# Patient Record
Sex: Female | Born: 1980 | Race: Black or African American | Hispanic: No | Marital: Married | Smoking: Current every day smoker
Health system: Southern US, Community
[De-identification: ages and names within clinical notes are randomized; demographics above are authoritative.]

---

## 2001-08-22 ENCOUNTER — Emergency Department (HOSPITAL_COMMUNITY): Admission: EM | Admit: 2001-08-22 | Discharge: 2001-08-23 | Payer: Self-pay | Admitting: Emergency Medicine

## 2003-11-30 ENCOUNTER — Other Ambulatory Visit: Payer: Self-pay

## 2005-10-08 ENCOUNTER — Emergency Department: Payer: Self-pay | Admitting: Emergency Medicine

## 2007-02-23 ENCOUNTER — Emergency Department: Payer: Self-pay | Admitting: Unknown Physician Specialty

## 2008-02-07 ENCOUNTER — Emergency Department: Payer: Self-pay | Admitting: Emergency Medicine

## 2008-04-25 ENCOUNTER — Emergency Department: Payer: Self-pay | Admitting: Emergency Medicine

## 2009-10-18 ENCOUNTER — Emergency Department: Payer: Self-pay | Admitting: Emergency Medicine

## 2010-03-09 ENCOUNTER — Observation Stay: Payer: Self-pay | Admitting: Obstetrics and Gynecology

## 2010-03-11 ENCOUNTER — Inpatient Hospital Stay: Payer: Self-pay | Admitting: Obstetrics and Gynecology

## 2013-05-30 ENCOUNTER — Emergency Department: Payer: Self-pay | Admitting: Emergency Medicine

## 2013-05-30 LAB — URINALYSIS, COMPLETE
Bilirubin,UR: NEGATIVE
Glucose,UR: NEGATIVE mg/dL (ref 0–75)
Ketone: NEGATIVE
Nitrite: POSITIVE
Ph: 7 (ref 4.5–8.0)
Specific Gravity: 1.019 (ref 1.003–1.030)
Squamous Epithelial: 10

## 2013-05-30 LAB — BASIC METABOLIC PANEL
Anion Gap: 5 — ABNORMAL LOW (ref 7–16)
BUN: 10 mg/dL (ref 7–18)
Calcium, Total: 9.3 mg/dL (ref 8.5–10.1)
EGFR (African American): >60
EGFR (Non-African Amer.): >60
Glucose: 103 mg/dL — ABNORMAL HIGH (ref 65–99)
Osmolality: 277 (ref 275–301)

## 2013-05-30 LAB — CBC
MCH: 28.5 pg (ref 26.0–34.0)
MCV: 84 fL (ref 80–100)
Platelet: 255 10*3/uL (ref 150–440)
WBC: 7 10*3/uL (ref 3.6–11.0)

## 2013-05-31 LAB — WET PREP, GENITAL

## 2013-05-31 LAB — GC/CHLAMYDIA PROBE AMP

## 2013-09-21 ENCOUNTER — Emergency Department: Payer: Self-pay | Admitting: Emergency Medicine

## 2013-09-21 LAB — COMPREHENSIVE METABOLIC PANEL
Albumin: 3.7 g/dL (ref 3.4–5.0)
Anion Gap: 2 — ABNORMAL LOW (ref 7–16)
BUN: 14 mg/dL (ref 7–18)
Bilirubin,Total: 0.7 mg/dL (ref 0.2–1.0)
Calcium, Total: 9.1 mg/dL (ref 8.5–10.1)
Creatinine: 1.21 mg/dL (ref 0.60–1.30)
EGFR (African American): >60
Potassium: 3.9 mmol/L (ref 3.5–5.1)
SGOT(AST): 25 U/L (ref 15–37)
Total Protein: 7.8 g/dL (ref 6.4–8.2)

## 2013-09-21 LAB — URINALYSIS, COMPLETE
Bilirubin,UR: NEGATIVE
Ketone: NEGATIVE
Nitrite: POSITIVE
Protein: 30
Squamous Epithelial: 6
WBC UR: 36 /HPF (ref 0–5)

## 2013-09-21 LAB — CBC WITH DIFFERENTIAL/PLATELET
Basophil %: 0.5 %
Eosinophil #: 0.1 10*3/uL (ref 0.0–0.7)
HCT: 35.2 % (ref 35.0–47.0)
Lymphocyte #: 1.5 10*3/uL (ref 1.0–3.6)
MCV: 84 fL (ref 80–100)
Monocyte %: 6.7 %
Neutrophil #: 3.8 10*3/uL (ref 1.4–6.5)
Platelet: 238 10*3/uL (ref 150–440)
RDW: 17.8 % — ABNORMAL HIGH (ref 11.5–14.5)
WBC: 5.8 10*3/uL (ref 3.6–11.0)

## 2013-09-21 LAB — LIPASE, BLOOD: Lipase: 48 U/L — ABNORMAL LOW (ref 73–393)

## 2013-11-20 ENCOUNTER — Emergency Department: Payer: Self-pay | Admitting: Emergency Medicine

## 2014-01-17 ENCOUNTER — Emergency Department: Payer: Self-pay | Admitting: Emergency Medicine

## 2014-01-17 LAB — CBC
HCT: 34.5 % — ABNORMAL LOW (ref 35.0–47.0)
HGB: 11.1 g/dL — AB (ref 12.0–16.0)
MCH: 27.7 pg (ref 26.0–34.0)
MCHC: 32.2 g/dL (ref 32.0–36.0)
MCV: 86 fL (ref 80–100)
Platelet: 241 10*3/uL (ref 150–440)
RBC: 4.02 10*6/uL (ref 3.80–5.20)
RDW: 16 % — AB (ref 11.5–14.5)
WBC: 6.5 10*3/uL (ref 3.6–11.0)

## 2014-01-17 LAB — COMPREHENSIVE METABOLIC PANEL
Albumin: 3.6 g/dL (ref 3.4–5.0)
Alkaline Phosphatase: 84 U/L
Anion Gap: 6 — ABNORMAL LOW (ref 7–16)
BUN: 8 mg/dL (ref 7–18)
Bilirubin,Total: 0.4 mg/dL (ref 0.2–1.0)
Calcium, Total: 8.7 mg/dL (ref 8.5–10.1)
Chloride: 109 mmol/L — ABNORMAL HIGH (ref 98–107)
Co2: 25 mmol/L (ref 21–32)
Creatinine: 0.84 mg/dL (ref 0.60–1.30)
EGFR (African American): >60
EGFR (Non-African Amer.): >60
Glucose: 86 mg/dL (ref 65–99)
Osmolality: 277 (ref 275–301)
Potassium: 3.8 mmol/L (ref 3.5–5.1)
SGOT(AST): 19 U/L (ref 15–37)
SGPT (ALT): 17 U/L (ref 12–78)
Sodium: 140 mmol/L (ref 136–145)
Total Protein: 7.8 g/dL (ref 6.4–8.2)

## 2014-03-02 ENCOUNTER — Emergency Department: Payer: Self-pay | Admitting: Emergency Medicine

## 2016-03-04 DIAGNOSIS — R0602 Shortness of breath: Secondary | ICD-10-CM | POA: Diagnosis not present

## 2016-03-04 DIAGNOSIS — F17210 Nicotine dependence, cigarettes, uncomplicated: Secondary | ICD-10-CM | POA: Diagnosis not present

## 2016-03-04 DIAGNOSIS — R0789 Other chest pain: Secondary | ICD-10-CM | POA: Diagnosis present

## 2016-03-04 NOTE — ED Notes (Signed)
Pt reports intermittent chest tightness with dry cough for 2 days; tonight the pain has been more constant; denies fever; dizziness/lightheaded 2 days ago but not currently; pt says pain worse with deep inspiration and when she lays down; pt with hoarse voice in triage; talking in complete coherent sentences

## 2016-03-05 ENCOUNTER — Emergency Department: Payer: Medicaid Other

## 2016-03-05 ENCOUNTER — Encounter: Payer: Self-pay | Admitting: Emergency Medicine

## 2016-03-05 ENCOUNTER — Emergency Department
Admission: EM | Admit: 2016-03-05 | Discharge: 2016-03-05 | Disposition: A | Discharge status: Home / Self Care | Payer: Medicaid Other | Attending: Emergency Medicine | Admitting: Emergency Medicine

## 2016-03-05 DIAGNOSIS — R0602 Shortness of breath: Secondary | ICD-10-CM

## 2016-03-05 DIAGNOSIS — R079 Chest pain, unspecified: Secondary | ICD-10-CM

## 2016-03-05 LAB — BASIC METABOLIC PANEL
ANION GAP: 7 (ref 5–15)
BUN: 11 mg/dL (ref 6–20)
CHLORIDE: 110 mmol/L (ref 101–111)
CO2: 21 mmol/L — ABNORMAL LOW (ref 22–32)
CREATININE: 0.74 mg/dL (ref 0.44–1.00)
Calcium: 9.2 mg/dL (ref 8.9–10.3)
GFR calc non Af Amer: >60 mL/min (ref >60)
Glucose, Bld: 97 mg/dL (ref 65–99)
POTASSIUM: 4 mmol/L (ref 3.5–5.1)
SODIUM: 138 mmol/L (ref 135–145)

## 2016-03-05 LAB — CBC
HCT: 33.8 % — ABNORMAL LOW (ref 35.0–47.0)
HEMOGLOBIN: 11 g/dL — AB (ref 12.0–16.0)
MCH: 26.5 pg (ref 26.0–34.0)
MCHC: 32.7 g/dL (ref 32.0–36.0)
MCV: 81.1 fL (ref 80.0–100.0)
PLATELETS: 252 10*3/uL (ref 150–440)
RBC: 4.16 MIL/uL (ref 3.80–5.20)
RDW: 15.6 % — ABNORMAL HIGH (ref 11.5–14.5)
WBC: 6 10*3/uL (ref 3.6–11.0)

## 2016-03-05 LAB — TROPONIN I

## 2016-03-05 LAB — FIBRIN DERIVATIVES D-DIMER (ARMC ONLY): Fibrin derivatives D-dimer (ARMC): 920 — ABNORMAL HIGH (ref 0–499)

## 2016-03-05 MED ORDER — AZITHROMYCIN 250 MG PO TABS: ORAL_TABLET | ORAL | Status: AC

## 2016-03-05 MED ORDER — ALBUTEROL SULFATE HFA 108 (90 BASE) MCG/ACT IN AERS: 2.0000 | INHALATION_SPRAY | Freq: Four times a day (QID) | RESPIRATORY_TRACT | Status: AC | PRN

## 2016-03-05 MED ORDER — IOPAMIDOL (ISOVUE-370) INJECTION 76%
75.0000 mL | Freq: Once | INTRAVENOUS | Status: AC | PRN
  Administered 2016-03-05: 75 mL via INTRAVENOUS

## 2016-03-05 MED ORDER — IPRATROPIUM-ALBUTEROL 0.5-2.5 (3) MG/3ML IN SOLN
3.0000 mL | Freq: Once | RESPIRATORY_TRACT | Status: AC
  Administered 2016-03-05: 3 mL via RESPIRATORY_TRACT
  Filled 2016-03-05: qty 3

## 2016-03-05 NOTE — ED Provider Notes (Signed)
[facility] Emergency Department Provider Note   ____________________________________________  Time seen: Approximately 210 AM  I have reviewed the triage vital signs and the nursing notes.   HISTORY  Chief Complaint Chest Pain    HPI Grace Webster is a 35 y.o. female comes into the hospital today with chest pain, shortness of breath and wheezing. The patient reports that her symptoms started 2 days ago. She reports that she does not remember exactly what was going on when the pain started. The pain is intermittent chest and she reports is worse when she takes a deep breath. The patient denies any recent travel. The pain does not radiate. The initial day she had some dizziness but since then she's been okay. She's had no sweats, nausea or vomiting. She has not taken anything for the pain. Currently the patient rates her pain a 4 out of 10 in intensity and is achy. She's never had this before. It was initially constant but now comes and goes. She's also had a mild cough with this pain. The patient was concerned about her symptoms she decided to come in to get checked out.   History reviewed. No pertinent past medical history.  There are no active problems to display for this patient.   History reviewed. No pertinent past surgical history.  Current Outpatient Rx  Name  Route  Sig  Dispense  Refill  . albuterol (PROVENTIL HFA;VENTOLIN HFA) 108 (90 Base) MCG/ACT inhaler   Inhalation   Inhale 2 puffs into the lungs every 6 (six) hours as needed.   1 Inhaler   0     Allergies Review of patient's allergies indicates no known allergies.  History reviewed. No pertinent family history.  Social History Social History  Substance Use Topics  . Smoking status: Current Every Day Smoker -- 5.00 packs/day    Types: Cigarettes  . Smokeless tobacco: None  . Alcohol Use: Yes     Comment: occasional    Review of Systems Constitutional: No  fever/chills Eyes: No visual changes. ENT: No sore throat. Cardiovascular: chest pain. Respiratory: Wheezing and shortness of breath. Gastrointestinal: No abdominal pain.  No nausea, no vomiting.  No diarrhea.  No constipation. Genitourinary: Negative for dysuria. Musculoskeletal: Negative for back pain. Skin: Negative for rash. Neurological: Negative for headaches, focal weakness or numbness.  10-point ROS otherwise negative.  ____________________________________________   PHYSICAL EXAM:  VITAL SIGNS: ED Triage Vitals  Enc Vitals Group     BP 03/04/16 2358 165/96 mmHg     Pulse Rate 03/04/16 2358 71     Resp 03/04/16 2358 18     Temp 03/04/16 2358 98.5 F (36.9 C)     Temp Source 03/04/16 2358 Oral     SpO2 03/04/16 2358 99 %     Weight 03/04/16 2358 240 lb (108.863 kg)     Height 03/04/16 2358 5\' 7"  (1.702 m)     Head Cir --      Peak Flow --      Pain Score 03/04/16 2359 4     Pain Loc --      Pain Edu? --      Excl. in GC? --     Constitutional: Alert and oriented. Well appearing and in Mild distress. Eyes: Conjunctivae are normal. PERRL. EOMI. Head: Atraumatic. Nose: No congestion/rhinnorhea. Mouth/Throat: Mucous membranes are moist.  Oropharynx non-erythematous. Cardiovascular: Normal rate, regular rhythm. Grossly normal heart sounds.  Good peripheral circulation. Respiratory: Normal respiratory effort.  No  retractions. Mild anterior wheezes. Gastrointestinal: Soft and nontender. No distention. Positive bowel sounds. Musculoskeletal: No lower extremity tenderness nor edema.  Neurologic:  Normal speech and language.  Skin:  Skin is warm, dry and intact.  Psychiatric: Mood and affect are normal.   ____________________________________________   LABS (all labs ordered are listed, but only abnormal results are displayed)  Labs Reviewed  BASIC METABOLIC PANEL - Abnormal; Notable for the following:    CO2 21 (*)    All other components within normal limits   CBC - Abnormal; Notable for the following:    Hemoglobin 11.0 (*)    HCT 33.8 (*)    RDW 15.6 (*)    All other components within normal limits  FIBRIN DERIVATIVES D-DIMER ([facility] ONLY) - Abnormal; Notable for the following:    Fibrin derivatives D-dimer ([facility]) 920 (*)    All other components within normal limits  TROPONIN I  TROPONIN I   ____________________________________________  EKG  ED ECG REPORT I, Rebecka Apley, the attending physician, personally viewed and interpreted this ECG.   Date: 03/05/2016  EKG Time: 0011  Rate: 72  Rhythm: normal sinus rhythm  Axis: normal  Intervals:none  ST&T Change: none  ____________________________________________  RADIOLOGY  CXR: Mild diffuse peribronchial thickening, which may relate to acute bronchiolitis, reactive airways disease/ asthma or history of smoking, No opacity to suggest pneumona   CT angio chest: Examination is nondiagnostic for evaluation of pulmonary arteries, pulmonary embolus is not excluded, diffuse fine nodular infiltration throughout the lungs maybe due to bronchopneumonia or other fine nodular infiltrative process. ____________________________________________   PROCEDURES  Procedure(s) performed: None  Critical Care performed: No  ____________________________________________   INITIAL IMPRESSION / ASSESSMENT AND PLAN / ED COURSE  Pertinent labs & imaging results that were available during my care of the patient were reviewed by me and considered in my medical decision making (see chart for details).  Is a 35 year old female who comes in status post today with some chest pain or shortness of breath. As the patient does have some pleuritic pain I did order a d-dimer which was positive. The patient did receive a DuoNeb as she had some wheezing as well but I will perform a CTA of her chest to evaluate for PE.  The patient reports that after the DuoNeb she feels much better. I informed her that the CT  of her chest did not see a PE but it was not diagnostic because of a poor bolus timing. The patient is not tachycardic nor is she hypoxic or hypotensive. Given that her symptoms improved with the breathing treatment and her vital signs are stable I think the patient is stable enough to be discharged home. The patient should follow-up with the acute care clinic for further evaluation. ____________________________________________   FINAL CLINICAL IMPRESSION(S) / ED DIAGNOSES  Final diagnoses:  Chest pain, unspecified chest pain type  Shortness of breath      NEW MEDICATIONS STARTED DURING THIS VISIT:  New Prescriptions   ALBUTEROL (PROVENTIL HFA;VENTOLIN HFA) 108 (90 BASE) MCG/ACT INHALER    Inhale 2 puffs into the lungs every 6 (six) hours as needed.     Note:  This document was prepared using Dragon voice recognition software and may include unintentional dictation errors.    Rebecka Apley, MD 03/05/16 [phone]

## 2016-03-05 NOTE — Discharge Instructions (Signed)
Nonspecific Chest Pain  °Chest pain can be caused by many different conditions. There is always a chance that your pain could be related to something serious, such as a heart attack or a blood clot in your lungs. Chest pain can also be caused by conditions that are not life-threatening. If you have chest pain, it is very important to follow up with your health care provider. °CAUSES  °Chest pain can be caused by: °· Heartburn. °· Pneumonia or bronchitis. °· Anxiety or stress. °· Inflammation around your heart (pericarditis) or lung (pleuritis or pleurisy). °· A blood clot in your lung. °· A collapsed lung (pneumothorax). It can develop suddenly on its own (spontaneous pneumothorax) or from trauma to the chest. °· Shingles infection (varicella-zoster virus). °· Heart attack. °· Damage to the bones, muscles, and cartilage that make up your chest wall. This can include: °· Bruised bones due to injury. °· Strained muscles or cartilage due to frequent or repeated coughing or overwork. °· Fracture to one or more ribs. °· Sore cartilage due to inflammation (costochondritis). °RISK FACTORS  °Risk factors for chest pain may include: °· Activities that increase your risk for trauma or injury to your chest. °· Respiratory infections or conditions that cause frequent coughing. °· Medical conditions or overeating that can cause heartburn. °· Heart disease or family history of heart disease. °· Conditions or health behaviors that increase your risk of developing a blood clot. °· Having had chicken pox (varicella zoster). °SIGNS AND SYMPTOMS °Chest pain can feel like: °· Burning or tingling on the surface of your chest or deep in your chest. °· Crushing, pressure, aching, or squeezing pain. °· Dull or sharp pain that is worse when you move, cough, or take a deep breath. °· Pain that is also felt in your back, neck, shoulder, or arm, or pain that spreads to any of these areas. °Your chest pain may come and go, or it may stay  constant. °DIAGNOSIS °Lab tests or other studies may be needed to find the cause of your pain. Your health care provider may have you take a test called an ambulatory ECG (electrocardiogram). An ECG records your heartbeat patterns at the time the test is performed. You may also have other tests, such as: °· Transthoracic echocardiogram (TTE). During echocardiography, sound waves are used to create a picture of all of the heart structures and to look at how blood flows through your heart. °· Transesophageal echocardiogram (TEE). This is a more advanced imaging test that obtains images from inside your body. It allows your health care provider to see your heart in finer detail. °· Cardiac monitoring. This allows your health care provider to monitor your heart rate and rhythm in real time. °· Holter monitor. This is a portable device that records your heartbeat and can help to diagnose abnormal heartbeats. It allows your health care provider to track your heart activity for several days, if needed. °· Stress tests. These can be done through exercise or by taking medicine that makes your heart beat more quickly. °· Blood tests. °· Imaging tests. °TREATMENT  °Your treatment depends on what is causing your chest pain. Treatment may include: °· Medicines. These may include: °· Acid blockers for heartburn. °· Anti-inflammatory medicine. °· Pain medicine for inflammatory conditions. °· Antibiotic medicine, if an infection is present. °· Medicines to dissolve blood clots. °· Medicines to treat coronary artery disease. °· Supportive care for conditions that do not require medicines. This may include: °· Resting. °· Applying heat   or cold packs to injured areas. °· Limiting activities until pain decreases. °HOME CARE INSTRUCTIONS °· If you were prescribed an antibiotic medicine, finish it all even if you start to feel better. °· Avoid any activities that bring on chest pain. °· Do not use any tobacco products, including  cigarettes, chewing tobacco, or electronic cigarettes. If you need help quitting, ask your health care provider. °· Do not drink alcohol. °· Take medicines only as directed by your health care provider. °· Keep all follow-up visits as directed by your health care provider. This is important. This includes any further testing if your chest pain does not go away. °· If heartburn is the cause for your chest pain, you may be told to keep your head raised (elevated) while sleeping. This reduces the chance that acid will go from your stomach into your esophagus. °· Make lifestyle changes as directed by your health care provider. These may include: °· Getting regular exercise. Ask your health care provider to suggest some activities that are safe for you. °· Eating a heart-healthy diet. A registered dietitian can help you to learn healthy eating options. °· Maintaining a healthy weight. °· Managing diabetes, if necessary. °· Reducing stress. °SEEK MEDICAL CARE IF: °· Your chest pain does not go away after treatment. °· You have a rash with blisters on your chest. °· You have a fever. °SEEK IMMEDIATE MEDICAL CARE IF:  °· Your chest pain is worse. °· You have an increasing cough, or you cough up blood. °· You have severe abdominal pain. °· You have severe weakness. °· You faint. °· You have chills. °· You have sudden, unexplained chest discomfort. °· You have sudden, unexplained discomfort in your arms, back, neck, or jaw. °· You have shortness of breath at any time. °· You suddenly start to sweat, or your skin gets clammy. °· You feel nauseous or you vomit. °· You suddenly feel light-headed or dizzy. °· Your heart begins to beat quickly, or it feels like it is skipping beats. °These symptoms may represent a serious problem that is an emergency. Do not wait to see if the symptoms will go away. Get medical help right away. Call your local emergency services (911 in the [place] Do not drive yourself to the hospital. °  °This  information is not intended to replace advice given to you by your health care provider. Make sure you discuss any questions you have with your health care provider. °  °Document Released: 07/26/2005 Document Revised: 11/06/2014 Document Reviewed: 05/22/2014 °Elsevier Interactive Patient Education ©2016 [employer] ° °Shortness of Breath °Shortness of breath means you have trouble breathing. It could also mean that you have a medical problem. You should get immediate medical care for shortness of breath. °CAUSES  °· Not enough oxygen in the air such as with high altitudes or a smoke-filled room. °· Certain lung diseases, infections, or problems. °· Heart disease or conditions, such as angina or heart failure. °· Low red blood cells (anemia). °· Poor physical fitness, which can cause shortness of breath when you exercise. °· Chest or back injuries or stiffness. °· Being overweight. °· Smoking. °· Anxiety, which can make you feel like you are not getting enough air. °DIAGNOSIS  °Serious medical problems can often be found during your physical exam. Tests may also be done to determine why you are having shortness of breath. Tests may include: °· Chest X-rays. °· Lung function tests. °· Blood tests. °· An electrocardiogram (ECG). °· An ambulatory   electrocardiogram. An ambulatory ECG records your heartbeat patterns over a 24-hour period. °· Exercise testing. °· A transthoracic echocardiogram (TTE). During echocardiography, sound waves are used to evaluate how blood flows through your heart. °· A transesophageal echocardiogram (TEE). °· Imaging scans. °Your health care provider may not be able to find a cause for your shortness of breath after your exam. In this case, it is important to have a follow-up exam with your health care provider as directed.  °TREATMENT  °Treatment for shortness of breath depends on the cause of your symptoms and can vary greatly. °HOME CARE INSTRUCTIONS  °· Do not smoke. Smoking is a common  cause of shortness of breath. If you smoke, ask for help to quit. °· Avoid being around chemicals or things that may bother your breathing, such as paint fumes and dust. °· Rest as needed. Slowly resume your usual activities. °· If medicines were prescribed, take them as directed for the full length of time directed. This includes oxygen and any inhaled medicines. °· Keep all follow-up appointments as directed by your health care provider. °SEEK MEDICAL CARE IF:  °· Your condition does not improve in the time expected. °· You have a hard time doing your normal activities even with rest. °· You have any new symptoms. °SEEK IMMEDIATE MEDICAL CARE IF:  °· Your shortness of breath gets worse. °· You feel light-headed, faint, or develop a cough not controlled with medicines. °· You start coughing up blood. °· You have pain with breathing. °· You have chest pain or pain in your arms, shoulders, or abdomen. °· You have a fever. °· You are unable to walk up stairs or exercise the way you normally do. °MAKE SURE YOU: °· Understand these instructions. °· Will watch your condition. °· Will get help right away if you are not doing well or get worse. °  °This information is not intended to replace advice given to you by your health care provider. Make sure you discuss any questions you have with your health care provider. °  °Document Released: 07/11/2001 Document Revised: 10/21/2013 Document Reviewed: 01/01/2012 °Elsevier Interactive Patient Education ©2016 [employer] Inc. ° °

## 2016-09-08 IMAGING — CT CT ANGIO CHEST
2 of 6 series · 19 of 46 positions shown · IV contrast (APPLIED)
Comparison: None.

CLINICAL DATA: Intermittent chest tightness and dry cough for 2
days. Increasing pain. Fever, dizziness, lightheaded. Shortness of
breath.

EXAM:
CT ANGIOGRAPHY CHEST WITH CONTRAST
TECHNIQUE: Multidetector CT imaging of the chest was performed using the
standard protocol during bolus administration of intravenous
contrast. Multiplanar CT image reconstructions and MIPs were
obtained to evaluate the vascular anatomy.
CONTRAST:  75 mL Isovue 370

[Series 6: pe thins 1.5 · axial · 0.68mm/px · z∈[-583,-301]mm · 16 of 263 slices shown]
[im 14/263  lung]
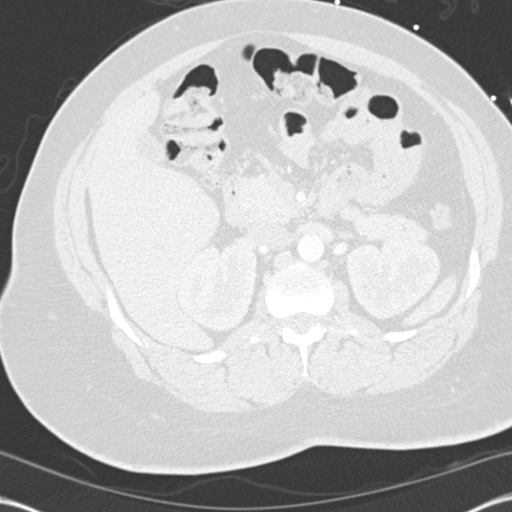
[im 28/263  soft-tissue]
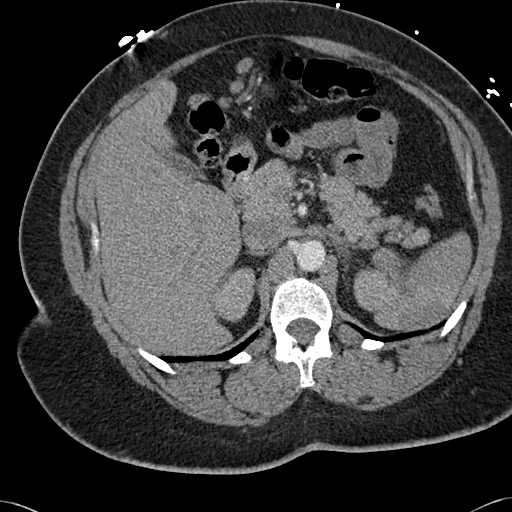
[im 42/263  lung]
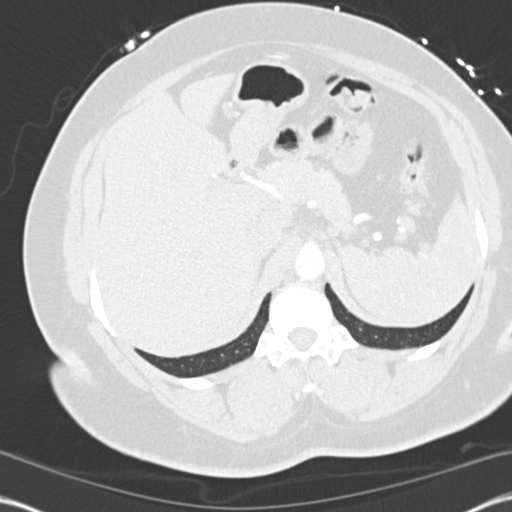
[im 56/263  soft-tissue]
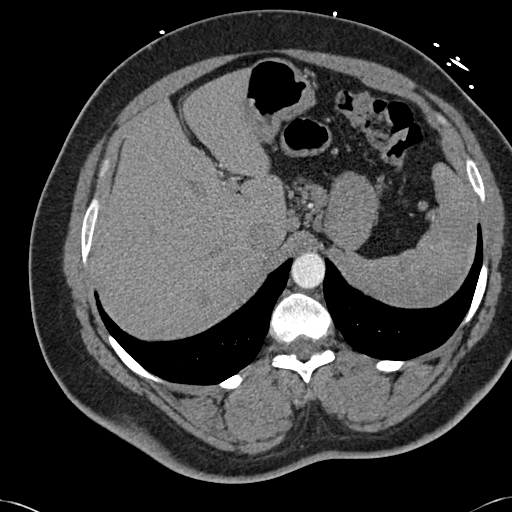
[im 83/263  lung]
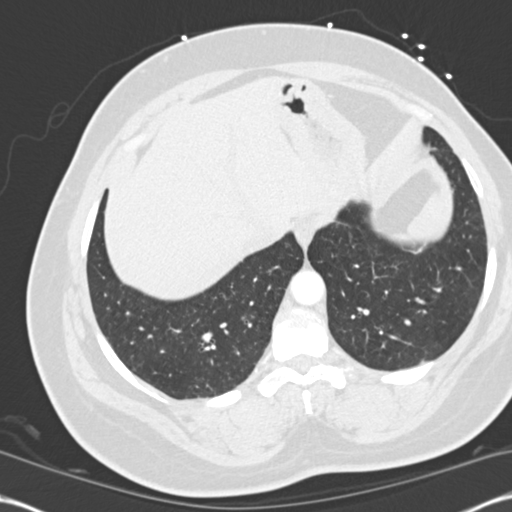
[im 97/263  soft-tissue]
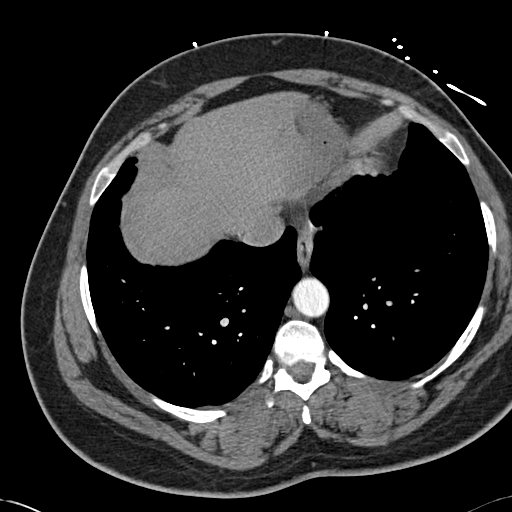
[im 111/263  lung]
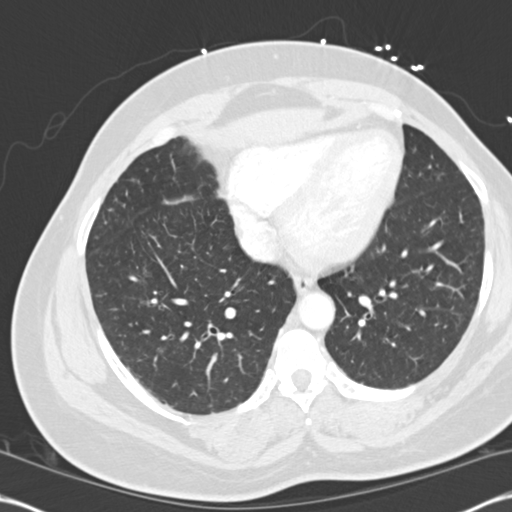
[im 125/263  soft-tissue]
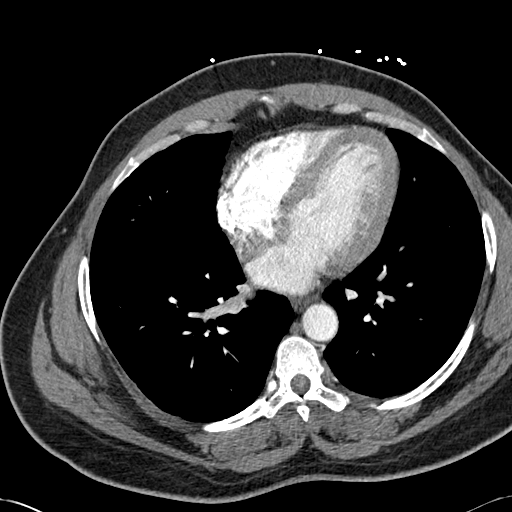
[im 138/263  lung]
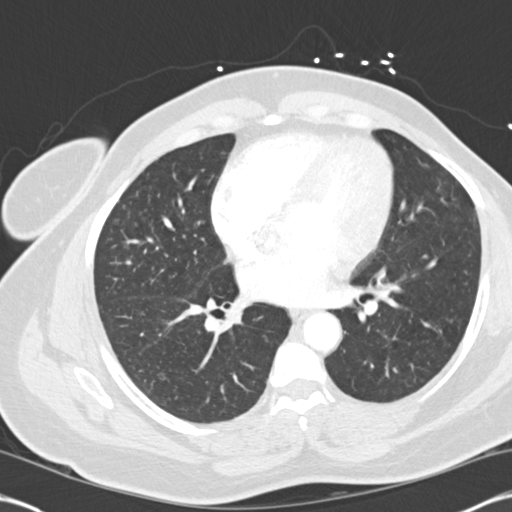
[im 152/263  soft-tissue]
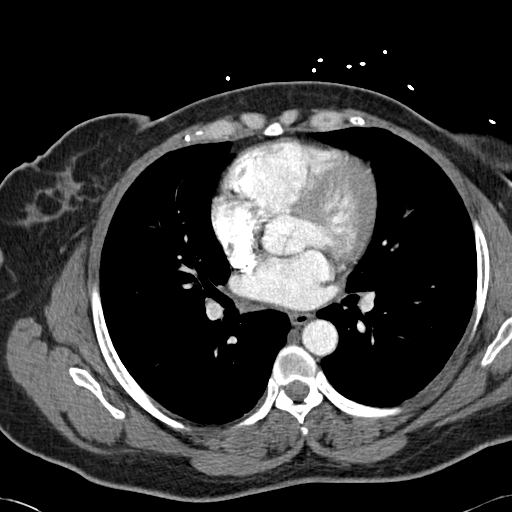
[im 166/263  lung]
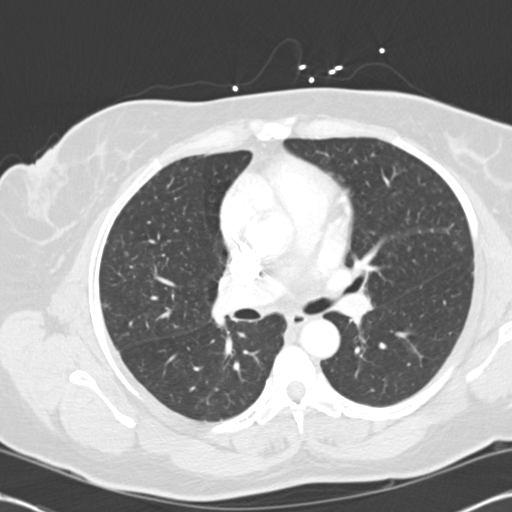
[im 180/263  soft-tissue]
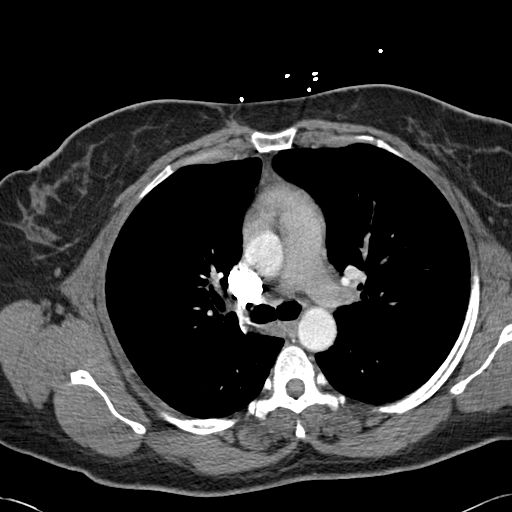
[im 207/263  lung]
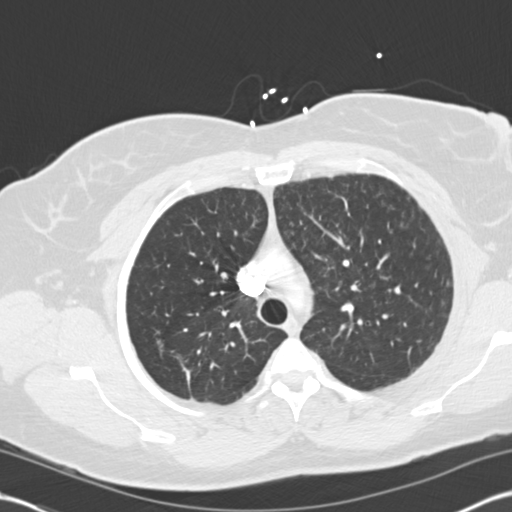
[im 221/263  soft-tissue]
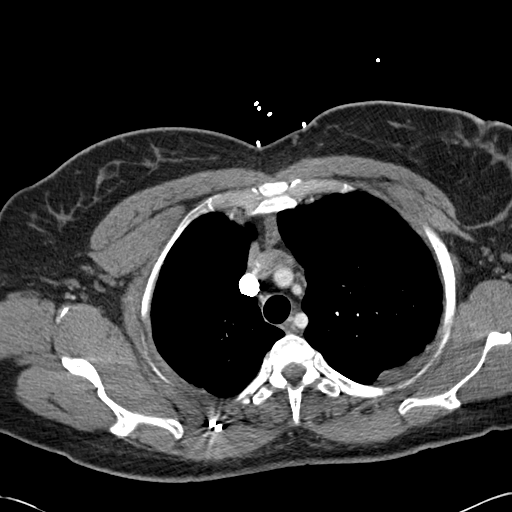
[im 235/263  lung]
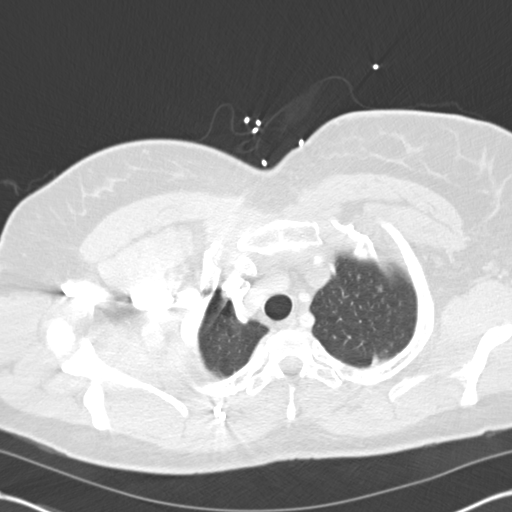
[im 249/263  soft-tissue]
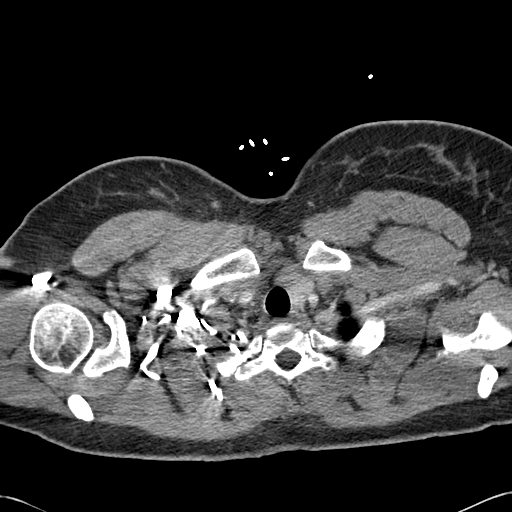

[Series 11: cor mpr 2.0 · coronal · 0.60mm/px · 3 of 139 slices shown]
[im 35/139  soft-tissue]
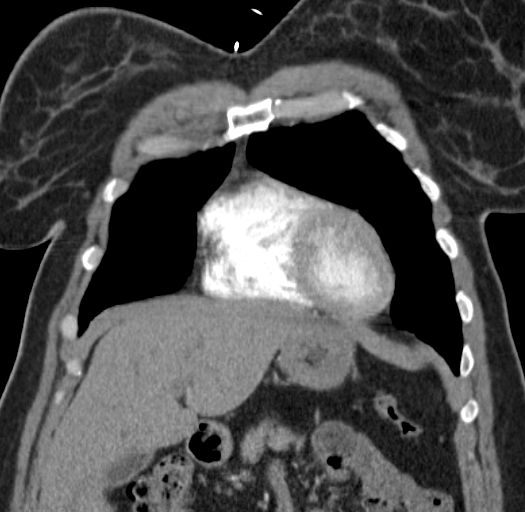
[im 70/139  soft-tissue]
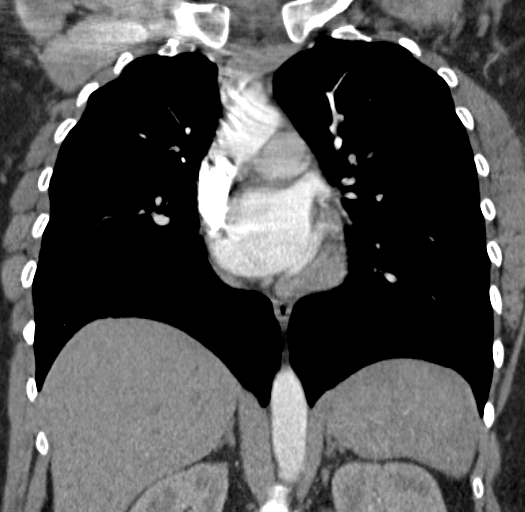
[im 104/139  soft-tissue]
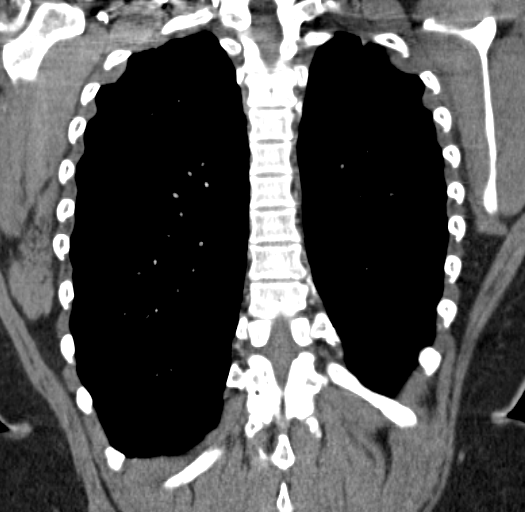

[19 of 46 positions shown; findings below may reference images not displayed]

FINDINGS: Examination is nondiagnostic for evaluation of the pulmonary
arteries due to poor bolus timing resulting in limited enhancement
of the pulmonary arteries.

Normal heart size. Normal caliber thoracic aorta. No aortic
dissection. Great vessel origins are patent. No significant
lymphadenopathy in the chest. Esophagus is decompressed.

Lungs demonstrate patchy diffuse fine nodular infiltration possibly
due to bronchopneumonia or other infiltrative process. Atypical
pneumonia could have this appearance. Could also consider chronic
pneumoconiosis. No focal consolidation. Airways are patent. No
pleural effusions. No pneumothorax.

Included portions of the upper abdominal organs are grossly
unremarkable. No destructive bone lesions.

Review of the MIP images confirms the above findings.
IMPRESSION: Examination is nondiagnostic for evaluation of pulmonary arteries.
Pulmonary embolus is not excluded.

Diffuse fine nodular infiltration throughout the lungs may be due to
bronchopneumonia or other fine nodular infiltrative process.

## 2019-11-10 ENCOUNTER — Ambulatory Visit: Payer: Medicaid Other | Attending: Internal Medicine

## 2019-11-10 DIAGNOSIS — Z20822 Contact with and (suspected) exposure to covid-19: Secondary | ICD-10-CM

## 2019-11-11 ENCOUNTER — Ambulatory Visit: Payer: Self-pay | Admitting: *Deleted

## 2019-11-11 ENCOUNTER — Telehealth: Payer: Self-pay | Admitting: *Deleted

## 2019-11-11 LAB — NOVEL CORONAVIRUS, NAA: SARS-CoV-2, NAA: NOT DETECTED

## 2019-11-11 NOTE — Telephone Encounter (Signed)
   Reason for Disposition . COVID-19 Testing, questions about  Answer Assessment - Initial Assessment Questions 1. COVID-19 DIAGNOSIS: "Who made your Coronavirus (COVID-19) diagnosis?" "Was it confirmed by a positive lab test?" If not diagnosed by a HCP, ask "Are there lots of cases (community spread) where you live?" (See public health department website, if unsure)     [place] 2. COVID-19 EXPOSURE: "Was there any known exposure to COVID before the symptoms began?" CDC Definition of close contact: within 6 feet (2 meters) for a total of 15 minutes or more over a 24-hour period.      na 3. ONSET: "When did the COVID-19 symptoms start?"      na 4. WORST SYMPTOM: "What is your worst symptom?" (e.g., cough, fever, shortness of breath, muscle aches)     na 5. COUGH: "Do you have a cough?" If so, ask: "How bad is the cough?"       na 6. FEVER: "Do you have a fever?" If so, ask: "What is your temperature, how was it measured, and when did it start?"     na 7. RESPIRATORY STATUS: "Describe your breathing?" (e.g., shortness of breath, wheezing, unable to speak)      na 8. BETTER-SAME-WORSE: "Are you getting better, staying the same or getting worse compared to yesterday?"  If getting worse, ask, "In what way?"     na 9. HIGH RISK DISEASE: "Do you have any chronic medical problems?" (e.g., asthma, heart or lung disease, weak immune system, obesity, etc.)     na 10. PREGNANCY: "Is there any chance you are pregnant?" "When was your last menstrual period?"       na 11. OTHER SYMPTOMS: "Do you have any other symptoms?"  (e.g., chills, fatigue, headache, loss of smell or taste, muscle pain, sore throat; new loss of smell or taste especially support the diagnosis of COVID-19)       Na, just wanting results.  Protocols used: CORONAVIRUS (COVID-19) DIAGNOSED OR SUSPECTED-A-AH

## 2019-11-11 NOTE — Telephone Encounter (Signed)
Pt called for COVID lab results, the test showed not detected. Pt verbalized understanding.

## 2020-05-15 ENCOUNTER — Emergency Department
Admission: EM | Admit: 2020-05-15 | Discharge: 2020-05-15 | Disposition: A | Discharge status: Home / Self Care | Payer: Medicaid Other | Attending: Emergency Medicine | Admitting: Emergency Medicine

## 2020-05-15 ENCOUNTER — Other Ambulatory Visit: Payer: Self-pay

## 2020-05-15 ENCOUNTER — Encounter: Payer: Self-pay | Admitting: Emergency Medicine

## 2020-05-15 DIAGNOSIS — F17210 Nicotine dependence, cigarettes, uncomplicated: Secondary | ICD-10-CM | POA: Insufficient documentation

## 2020-05-15 DIAGNOSIS — K029 Dental caries, unspecified: Secondary | ICD-10-CM | POA: Insufficient documentation

## 2020-05-15 DIAGNOSIS — K047 Periapical abscess without sinus: Secondary | ICD-10-CM | POA: Insufficient documentation

## 2020-05-15 MED ORDER — AMOXICILLIN 875 MG PO TABS: 875.0000 mg | ORAL_TABLET | Freq: Two times a day (BID) | ORAL | 0 refills | Status: DC

## 2020-05-15 MED ORDER — HYDROCODONE-ACETAMINOPHEN 5-325 MG PO TABS: 1.0000 | ORAL_TABLET | Freq: Four times a day (QID) | ORAL | 0 refills | Status: DC | PRN

## 2020-05-15 MED ORDER — IBUPROFEN 800 MG PO TABS
800.0000 mg | ORAL_TABLET | Freq: Once | ORAL | Status: AC
  Administered 2020-05-15: 800 mg via ORAL
  Filled 2020-05-15: qty 1

## 2020-05-15 NOTE — ED Provider Notes (Signed)
[facility] Emergency Department Provider Note  ____________________________________________   First MD Initiated Contact with Patient 05/15/20 1341     (approximate)  I have reviewed the triage vital signs and the nursing notes.   HISTORY  Chief Complaint Dental Pain    HPI Grace Webster is a 39 y.o. female presents emergency department complaint of dental abscess on the right upper gumline.  Some swelling in the face.  No fever or chills.  Area is very painful.  She states she called multiple dentist office and was unable to get an appointment.  She does have dental insurance.    History reviewed. No pertinent past medical history.  There are no problems to display for this patient.   History reviewed. No pertinent surgical history.  Prior to Admission medications   Medication Sig Start Date End Date Taking? Authorizing Provider  albuterol (PROVENTIL HFA;VENTOLIN HFA) 108 (90 Base) MCG/ACT inhaler Inhale 2 puffs into the lungs every 6 (six) hours as needed. 03/05/16   Rebecka Apley, MD  amoxicillin (AMOXIL) 875 MG tablet Take 1 tablet (875 mg total) by mouth 2 (two) times daily. 05/15/20   Cynitha Berte, Roselyn Bering, PA-C  HYDROcodone-acetaminophen (NORCO/VICODIN) 5-325 MG tablet Take 1 tablet by mouth every 6 (six) hours as needed for moderate pain. 05/15/20   Faythe Ghee, PA-C    Allergies Patient has no known allergies.  History reviewed. No pertinent family history.  Social History Social History   Tobacco Use   Smoking status: Current Every Day Smoker    Packs/day: 5.00    Types: Cigarettes   Smokeless tobacco: Never Used  Substance Use Topics   Alcohol use: Yes    Comment: occasional   Drug use: No    Review of Systems  Constitutional: No fever/chills Eyes: No visual changes. ENT: No sore throat.  Positive for dental Respiratory: Denies cough Cardiovascular: Denies chest pain Genitourinary: Negative for  dysuria. Musculoskeletal: Negative for back pain. Skin: Negative for rash. Psychiatric: no mood changes,     ____________________________________________   PHYSICAL EXAM:  VITAL SIGNS: ED Triage Vitals  Enc Vitals Group     BP 05/15/20 1330 (!) 171/103     Pulse Rate 05/15/20 1330 86     Resp 05/15/20 1330 18     Temp 05/15/20 1330 99.1 F (37.3 C)     Temp Source 05/15/20 1330 Oral     SpO2 05/15/20 1330 99 %     Weight 05/15/20 1332 230 lb (104.3 kg)     Height 05/15/20 1332 5\' 7"  (1.702 m)     Head Circumference --      Peak Flow --      Pain Score 05/15/20 1332 10     Pain Loc --      Pain Edu? --      Excl. in [place]? --     Constitutional: Alert and oriented. Well appearing and in no acute distress. Eyes: Conjunctivae are normal.  Head: Atraumatic. Nose: No congestion/rhinnorhea. Mouth/Throat: Mucous membranes are moist.  Poor dentition noted, gum is swollen on the right upper molars Neck:  supple no lymphadenopathy noted Cardiovascular: Normal rate, regular rhythm. Heart sounds are normal Respiratory: Normal respiratory effort.  No retractions, lungs c t a  GU: deferred Musculoskeletal: FROM all extremities, warm and well perfused Neurologic:  Normal speech and language.  Skin:  Skin is warm, dry and intact. No rash noted. Psychiatric: Mood and affect are normal. Speech and behavior are normal.  ____________________________________________   LABS (all labs ordered are listed, but only abnormal results are displayed)  Labs Reviewed - No data to display ____________________________________________   ____________________________________________  RADIOLOGY    ____________________________________________   PROCEDURES  Procedure(s) performed: No  Procedures    ____________________________________________   INITIAL IMPRESSION / ASSESSMENT AND PLAN / ED COURSE  Pertinent labs & imaging results that were available during my care of the patient  were reviewed by me and considered in my medical decision making (see chart for details).   Patient is 39 year old female presents emergency department with dental abscess.  Physical exam shows the right upper molars to have multiple Dental caries.  Gum is swollen.  Remainder exam is unremarkable  Did explain findings to the patient.  Skin prescription for amoxicillin and Vicodin.  She is to follow-up with her regular doctor if not improving.  Take over-the-counter ibuprofen.  She is given a list of dental clinics.  Discharged stable condition.      As part of my medical decision making, I reviewed the following data within the electronic MEDICAL RECORD NUMBER Nursing notes reviewed and incorporated, Old chart reviewed, Notes from prior ED visits and [place] Controlled Substance Database  ____________________________________________   FINAL CLINICAL IMPRESSION(S) / ED DIAGNOSES  Final diagnoses:  Dental abscess      NEW MEDICATIONS STARTED DURING THIS VISIT:  New Prescriptions   AMOXICILLIN (AMOXIL) 875 MG TABLET    Take 1 tablet (875 mg total) by mouth 2 (two) times daily.   HYDROCODONE-ACETAMINOPHEN (NORCO/VICODIN) 5-325 MG TABLET    Take 1 tablet by mouth every 6 (six) hours as needed for moderate pain.     Note:  This document was prepared using Dragon voice recognition software and may include unintentional dictation errors.    Faythe Ghee, PA-C 05/15/20 1516    Concha Se, MD 05/16/20 0800

## 2020-05-15 NOTE — Discharge Instructions (Signed)
OPTIONS FOR DENTAL FOLLOW UP CARE ° °[place] Department of Health and Human Services - Local Safety Net Dental Clinics °http://www.ncdhhs.gov/dph/oralhealth/services/safetynetclinics.htm °  °[facility] (336-562-3123) ° °[facility] [place] (919-933-9087) ° °[facility] ([phone] ext [phone]) ° °[facility] ([phone]) ° °[facility] ([phone]) °This clinic caters to the indigent population and is on a lottery system. °Location: °[facility], [address], [place] °Clinic Hours: °Wednesdays from 6pm - 9pm, patients seen by a lottery system. °For dates, call or go to www.med.[facility].edu/[facility]/patients/Dental-[facility] °Services: °Cleanings, fillings and simple extractions. °Payment Options: °DENTAL WORK IS FREE OF CHARGE. Bring proof of income or support. °Best way to get seen: °Arrive at 5:15 pm - this is a lottery, NOT first come/first serve, so arriving earlier will not increase your chances of being seen. °  °  °[facility] °[phone] °Select option 1 for emergencies °  °Location: °[facility], [address], [place] °Clinic Hours: °No walk-ins accepted - call the day before to schedule an appointment. °Check in times are 9:30 am and 1:30 pm. °Services: °Simple extractions, temporary fillings, pulpectomy/pulp debridement, uncomplicated abscess drainage. °Payment Options: °PAYMENT IS DUE AT THE TIME OF SERVICE.  Fee is usually $100-200, additional surgical procedures (e.g. abscess drainage) may be extra. °Cash, checks, Visa/MasterCard accepted.  Can file Medicaid if patient is covered for dental - patient should call case worker to check. °No discount for [facility] patients. °Best way to get seen: °MUST call the day before and get onto the schedule. Can usually be seen the next 1-2 days. No walk-ins accepted. °  °  °[facility] °[phone] °   °Location: °[facility], [address], [place] °Clinic Hours: °M, W, Th, F 8am or 1:30pm, Tues 9a or 1:30 - first come/first served. °Services: °Simple extractions, temporary fillings, uncomplicated abscess drainage.  You do not need to be an [place] resident. °Payment Options: °PAYMENT IS DUE AT THE TIME OF SERVICE. °Dental insurance, otherwise sliding scale - bring proof of income or support. °Depending on income and treatment needed, cost is usually $50-200. °Best way to get seen: °Arrive early as it is first come/first served. °  °  °[facility] °[phone] °  °Location: °[address] °Clinic Hours: °Mon-Thu 8a-5p °Services: °Most basic dental services including extractions and fillings. °Payment Options: °PAYMENT IS DUE AT THE TIME OF SERVICE. °Sliding scale, up to 50% off - bring proof if income or support. °Medicaid with dental option accepted. °Best way to get seen: °Call to schedule an appointment, can usually be seen within 2 weeks OR they will try to see walk-ins - show up at 8a or 2p (you may have to wait). °  °  °[facility] °[phone] °[place] RESIDENTS ONLY °  °Location: °[facility], [address], [place], [place] [address] °Clinic Hours: By appointment only. °Monday - Thursday 8am-5pm, Friday 8am-12pm °Services: Cleanings, fillings, extractions. °Payment Options: °PAYMENT IS DUE AT THE TIME OF SERVICE. °Cash, Visa or MasterCard. Sliding scale - $30 minimum per service. °Best way to get seen: °Come in to office, complete packet and make an appointment - need proof of income °or support monies for each household member and proof of [place] residence. °Usually takes about a month to get in. °  °  °[facility] °[phone] °  °Location: °[address]   [place] °Clinic Hours: Walk-in Urgent Care Dental Services are offered Monday-Friday  mornings only. °The numbers of emergencies accepted daily is limited to the number of °providers available. °Maximum 15 - Mondays, Wednesdays & Thursdays °Maximum 10 - Tuesdays & Fridays °Services: °You do not need to be a [place] resident to be seen for a dental emergency. °Emergencies are defined as pain, swelling, abnormal bleeding, or dental trauma. Walkins will receive x-rays if needed. °NOTE: Dental cleaning is not an emergency. °Payment Options: °PAYMENT IS DUE AT THE TIME OF SERVICE. °Minimum co-pay is $40.00 for uninsured patients. °Minimum co-pay is $3.00 for Medicaid with dental coverage. °Dental Insurance is accepted and must be presented at time of visit. °Medicare does not cover dental. °Forms of payment: Cash, credit card, checks. °Best way to get seen: °If not previously registered with the clinic, walk-in dental registration begins at 7:15 am and is on a first come/first serve basis. °If previously registered with the clinic, call to make an appointment. °  °  °[facility] °[phone] °[place] RESIDENTS ONLY °  °Location: °[address], [place], [place] °Clinic Hours: °Mon-Thu 10a-2p °Services: Extractions only! °Payment Options: °FREE (donations accepted) - bring proof of income or support °Best way to get seen: °Call and schedule an appointment OR come at 8am on the 1st Monday of every month (except for holidays) when it is first come/first served. °  °  °[facility] °[phone] °  °Location: °[address], [place] °Clinic Hours: °Friday mornings °Services, Payment Options, Best way to get seen: °Call for info °

## 2020-05-15 NOTE — ED Triage Notes (Signed)
Presents with possible dental abscess  States she is having pain to right upper gum line  Min swelling noted

## 2020-09-16 ENCOUNTER — Telehealth: Payer: Self-pay | Admitting: Family Medicine

## 2020-09-16 NOTE — Telephone Encounter (Signed)
Call to patient. Patient reports having some anxiety and needing mental health services. Also needing a MD to sign some papers soon. RN provided patient with Kathreen Cosier LCSW card for mental health and provided patient with number and hours of [facility] to talk with a primary doctor and have them fill out papers. Patient expressed understanding of plan.   Harvie Heck, RN

## 2020-09-16 NOTE — Telephone Encounter (Signed)
Patient specifically wanted to speak with Dr. Alvester Morin.

## 2022-07-31 ENCOUNTER — Other Ambulatory Visit: Payer: Self-pay

## 2022-07-31 ENCOUNTER — Emergency Department: Payer: 59

## 2022-07-31 ENCOUNTER — Emergency Department
Admission: EM | Admit: 2022-07-31 | Discharge: 2022-07-31 | Disposition: A | Discharge status: Home / Self Care | Payer: 59 | Attending: Emergency Medicine | Admitting: Emergency Medicine

## 2022-07-31 ENCOUNTER — Encounter: Payer: Self-pay | Admitting: Emergency Medicine

## 2022-07-31 DIAGNOSIS — K769 Liver disease, unspecified: Secondary | ICD-10-CM | POA: Diagnosis not present

## 2022-07-31 DIAGNOSIS — R1011 Right upper quadrant pain: Secondary | ICD-10-CM | POA: Diagnosis present

## 2022-07-31 LAB — CBC WITH DIFFERENTIAL/PLATELET
Abs Immature Granulocytes: 0.02 10*3/uL (ref 0.00–0.07)
Basophils Absolute: 0 10*3/uL (ref 0.0–0.1)
Basophils Relative: 1 %
Eosinophils Absolute: 0.1 10*3/uL (ref 0.0–0.5)
Eosinophils Relative: 1 %
HCT: 30.2 % — ABNORMAL LOW (ref 36.0–46.0)
Hemoglobin: 9 g/dL — ABNORMAL LOW (ref 12.0–15.0)
Immature Granulocytes: 0 %
Lymphocytes Relative: 38 %
Lymphs Abs: 2.4 10*3/uL (ref 0.7–4.0)
MCH: 24.5 pg — ABNORMAL LOW (ref 26.0–34.0)
MCHC: 29.8 g/dL — ABNORMAL LOW (ref 30.0–36.0)
MCV: 82.3 fL (ref 80.0–100.0)
Monocytes Absolute: 0.3 10*3/uL (ref 0.1–1.0)
Monocytes Relative: 5 %
Neutro Abs: 3.5 10*3/uL (ref 1.7–7.7)
Neutrophils Relative %: 55 %
Platelets: 224 10*3/uL (ref 150–400)
RBC: 3.67 MIL/uL — ABNORMAL LOW (ref 3.87–5.11)
RDW: 18.1 % — ABNORMAL HIGH (ref 11.5–15.5)
WBC: 6.4 10*3/uL (ref 4.0–10.5)
nRBC: 0 % (ref 0.0–0.2)

## 2022-07-31 LAB — COMPREHENSIVE METABOLIC PANEL
ALT: 11 U/L (ref 0–44)
AST: 17 U/L (ref 15–41)
Albumin: 3.7 g/dL (ref 3.5–5.0)
Alkaline Phosphatase: 66 U/L (ref 38–126)
Anion gap: 4 — ABNORMAL LOW (ref 5–15)
BUN: 14 mg/dL (ref 6–20)
CO2: 24 mmol/L (ref 22–32)
Calcium: 8.9 mg/dL (ref 8.9–10.3)
Chloride: 111 mmol/L (ref 98–111)
Creatinine, Ser: 0.68 mg/dL (ref 0.44–1.00)
GFR, Estimated: >60 mL/min (ref >60)
Glucose, Bld: 101 mg/dL — ABNORMAL HIGH (ref 70–99)
Potassium: 3.9 mmol/L (ref 3.5–5.1)
Sodium: 139 mmol/L (ref 135–145)
Total Bilirubin: 0.6 mg/dL (ref 0.3–1.2)
Total Protein: 8.6 g/dL — ABNORMAL HIGH (ref 6.5–8.1)

## 2022-07-31 LAB — URINALYSIS, ROUTINE W REFLEX MICROSCOPIC
Bilirubin Urine: NEGATIVE
Glucose, UA: NEGATIVE mg/dL
Ketones, ur: NEGATIVE mg/dL
Nitrite: NEGATIVE
Protein, ur: NEGATIVE mg/dL
Specific Gravity, Urine: 1.014 (ref 1.005–1.030)
pH: 7 (ref 5.0–8.0)

## 2022-07-31 LAB — LIPASE, BLOOD: Lipase: 31 U/L (ref 11–51)

## 2022-07-31 LAB — POC URINE PREG, ED: Preg Test, Ur: NEGATIVE

## 2022-07-31 MED ORDER — HYDROCODONE-ACETAMINOPHEN 5-325 MG PO TABS: 1.0000 | ORAL_TABLET | Freq: Four times a day (QID) | ORAL | 0 refills | Status: AC | PRN

## 2022-07-31 MED ORDER — HYOSCYAMINE SULFATE SL 0.125 MG SL SUBL: 0.1250 mg | SUBLINGUAL_TABLET | Freq: Four times a day (QID) | SUBLINGUAL | 0 refills | Status: AC | PRN

## 2022-07-31 NOTE — ED Provider Notes (Signed)
[facility] Provider Note    Event Date/Time   First MD Initiated Contact with Patient 07/31/22 [phone]     (approximate)   History   Flank Pain   HPI  Grace Webster is a 41 y.o. female with no significant past medical history as as listed in EMR presents to the emergency department for treatment and evaluation of right upper abdominal pain that radiates into the right flank. Pain is intermittent. No associated nausea or vomiting, but she has had a decreased appetite for the past couple of days. No fever, diarrhea, dysuria, or known exposures. No alleviating measures prior to arrival.           Physical Exam   Triage Vital Signs: ED Triage Vitals  Enc Vitals Group     BP 07/31/22 0949 (!) 190/108     Pulse Rate 07/31/22 0949 76     Resp 07/31/22 0949 18     Temp 07/31/22 0949 98.3 F (36.8 C)     Temp Source 07/31/22 0949 Oral     SpO2 07/31/22 0949 97 %     Weight 07/31/22 0945 254 lb (115.2 kg)     Height 07/31/22 0945 5\' 7"  (1.702 m)     Head Circumference --      Peak Flow --      Pain Score 07/31/22 0945 10     Pain Loc --      Pain Edu? --      Excl. in [place]? --     Most recent vital signs: Vitals:   07/31/22 0949 07/31/22 1320  BP: (!) 190/108 (!) 178/91  Pulse: 76 70  Resp: 18 18  Temp: 98.3 F (36.8 C)   SpO2: 97% 98%     General: Awake, no distress.  CV:  Good peripheral perfusion.  Resp:  Normal effort.  Abd:  No distention. Soft. Focal RUQ tenderness. No RLQ rebound tenderness. Other:     ED Results / Procedures / Treatments   Labs (all labs ordered are listed, but only abnormal results are displayed) Labs Reviewed  URINALYSIS, ROUTINE W REFLEX MICROSCOPIC - Abnormal; Notable for the following components:      Result Value   Color, Urine YELLOW (*)    APPearance HAZY (*)    Hgb urine dipstick SMALL (*)    Leukocytes,Ua TRACE (*)    Bacteria, UA RARE (*)    All other components within normal limits  CBC WITH  DIFFERENTIAL/PLATELET - Abnormal; Notable for the following components:   RBC 3.67 (*)    Hemoglobin 9.0 (*)    HCT 30.2 (*)    MCH 24.5 (*)    MCHC 29.8 (*)    RDW 18.1 (*)    All other components within normal limits  COMPREHENSIVE METABOLIC PANEL - Abnormal; Notable for the following components:   Glucose, Bld 101 (*)    Total Protein 8.6 (*)    Anion gap 4 (*)    All other components within normal limits  LIPASE, BLOOD  POC URINE PREG, ED     EKG  Not indicated.   RADIOLOGY Ultrasound of the right upper quadrant of the abdomen reviewed and interpreted by me: No obvious wall thickening or stone disease.  CT renal stone study interpreted and viewed by me: Radiopaque density in the left kidney.  Radiology report indicates no evidence of ureteral calculi, 9 mm left renal stone, no hydronephrosis, multiple uterine fibroids, and 2 small low-attenuation lesions in the  posterior right hepatic lobe not well characterized without contrast.    PROCEDURES:  Critical Care performed: No  Procedures   MEDICATIONS ORDERED IN ED: Medications - No data to display   IMPRESSION / MDM / ASSESSMENT AND PLAN / ED COURSE  I reviewed the triage vital signs and the nursing notes.                              Differential diagnosis includes, but is not limited to, acute cholecystitis, nephrolithiasis, ureterolithiasis colitis, retained stool.  Patient's presentation is most consistent with acute presentation with potential threat to life or bodily function.  41 year old female presenting to the emergency department for treatment and evaluation of right upper quadrant abdominal pain.  See HPI for further details.  Exam is most consistent with cholecystitis/cholelithiasis.  Labs, ultrasound, and urinalysis to be performed.  Patient aware and agreeable to the plan.  Ultrasound negative for acute cholecystitis or cholelithiasis.  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  Results discussed with the patient.  Because she does have some flank pain and small amount of hemoglobin noted in the urine, plan will be to get a CT renal stone study.  CT scan shows no explanation for acute flank or abdominal pain.  She does have 2 lesions on the liver not well characterized on imaging today.  She had a CT in 2017 that demonstrated 2 lesions at that time as well.  Patient and I discussed having MRI with and without contrast today for further clarification or following up with PCP.  Patient feels that she has reliable outpatient follow-up and will discuss with primary care.  No MRI today.  She feels comfortable going home with symptomatic treatment.  ER return precautions discussed.     FINAL CLINICAL IMPRESSION(S) / ED DIAGNOSES   Final diagnoses:  RUQ abdominal pain  Hepatic lesion     Rx / DC Orders   ED Discharge Orders          Ordered    Hyoscyamine Sulfate SL (LEVSIN/SL) 0.125 MG SUBL  Every 6 hours PRN        07/31/22 1313    HYDROcodone-acetaminophen (NORCO/VICODIN) 5-325 MG tablet  Every 6 hours PRN        07/31/22 1313             Note:  This document was prepared using Dragon voice recognition software and may include unintentional dictation errors.   Chinita Pester, FNP 07/31/22 1422    Minna Antis, MD 07/31/22 1429

## 2022-07-31 NOTE — ED Triage Notes (Signed)
First Nurse Note:  C/O right side / back pain x 3 days.  States hurts worse with movement.  AAOx3.  Skin warm and dry. NAD

## 2022-07-31 NOTE — Discharge Instructions (Signed)
Please call and schedule follow-up appointment with primary care.  Return to the emergency department if you have pain or other symptoms of concern and you are unable to see primary care right away.

## 2022-07-31 NOTE — ED Triage Notes (Signed)
Right flank pain x 2 days. Denies any urinary issues, denies any injury

## 2022-07-31 NOTE — ED Notes (Signed)
See triage note  Presents with abd pain  States pain does radiate to side Denies any fever   slight nausea
